# Patient Record
Sex: Female | Born: 1960 | Race: Black or African American | Hispanic: No
Health system: Southern US, Community
[De-identification: ages and names within clinical notes are randomized; demographics above are authoritative.]

---

## 2012-12-17 ENCOUNTER — Emergency Department: Payer: Self-pay | Admitting: Emergency Medicine

## 2012-12-17 LAB — CBC
HCT: 45.2 % (ref 35.0–47.0)
HGB: 14.5 g/dL (ref 12.0–16.0)
MCHC: 32 g/dL (ref 32.0–36.0)
MCV: 71 fL — ABNORMAL LOW (ref 80–100)
Platelet: 192 10*3/uL (ref 150–440)
RDW: 14.7 % — ABNORMAL HIGH (ref 11.5–14.5)
WBC: 8.4 10*3/uL (ref 3.6–11.0)

## 2012-12-17 LAB — URINALYSIS, COMPLETE
Bacteria: NONE SEEN
Blood: NEGATIVE
Ketone: NEGATIVE
Nitrite: NEGATIVE
Ph: 6 (ref 4.5–8.0)
RBC,UR: 4 /HPF (ref 0–5)
Squamous Epithelial: 6

## 2012-12-17 LAB — COMPREHENSIVE METABOLIC PANEL
Albumin: 3.8 g/dL (ref 3.4–5.0)
Alkaline Phosphatase: 74 U/L (ref 50–136)
Anion Gap: 4 — ABNORMAL LOW (ref 7–16)
Bilirubin,Total: 0.6 mg/dL (ref 0.2–1.0)
Calcium, Total: 8.5 mg/dL (ref 8.5–10.1)
EGFR (Non-African Amer.): >60
Osmolality: 277 (ref 275–301)
Potassium: 4 mmol/L (ref 3.5–5.1)
Sodium: 139 mmol/L (ref 136–145)
Total Protein: 7.7 g/dL (ref 6.4–8.2)

## 2013-08-29 ENCOUNTER — Observation Stay: Payer: Self-pay | Admitting: Internal Medicine

## 2013-08-29 LAB — DRUG SCREEN, URINE
Benzodiazepine, Ur Scrn: NEGATIVE (ref ?–200)
MDMA (Ecstasy)Ur Screen: NEGATIVE (ref ?–500)
Methadone, Ur Screen: NEGATIVE (ref ?–300)
Opiate, Ur Screen: NEGATIVE (ref ?–300)
Phencyclidine (PCP) Ur S: NEGATIVE (ref ?–25)

## 2013-08-29 LAB — COMPREHENSIVE METABOLIC PANEL
Albumin: 3.3 g/dL — ABNORMAL LOW (ref 3.4–5.0)
Alkaline Phosphatase: 84 U/L (ref 50–136)
Bilirubin,Total: 0.5 mg/dL (ref 0.2–1.0)
Calcium, Total: 8.3 mg/dL — ABNORMAL LOW (ref 8.5–10.1)
Chloride: 111 mmol/L — ABNORMAL HIGH (ref 98–107)
Creatinine: 1.01 mg/dL (ref 0.60–1.30)
EGFR (Non-African Amer.): >60
Glucose: 93 mg/dL (ref 65–99)
Osmolality: 280 (ref 275–301)
SGOT(AST): 16 U/L (ref 15–37)

## 2013-08-29 LAB — CBC
HCT: 40.1 % (ref 35.0–47.0)
MCHC: 31.9 g/dL — ABNORMAL LOW (ref 32.0–36.0)
MCV: 71 fL — ABNORMAL LOW (ref 80–100)
Platelet: 174 10*3/uL (ref 150–440)
RBC: 5.67 10*6/uL — ABNORMAL HIGH (ref 3.80–5.20)
RDW: 14.7 % — ABNORMAL HIGH (ref 11.5–14.5)
WBC: 11.9 10*3/uL — ABNORMAL HIGH (ref 3.6–11.0)

## 2013-08-29 LAB — URINALYSIS, COMPLETE
Bacteria: NONE SEEN
Bilirubin,UR: NEGATIVE
Glucose,UR: NEGATIVE mg/dL (ref 0–75)
Hyaline Cast: 8
Leukocyte Esterase: NEGATIVE
Protein: 100
Specific Gravity: 1.021 (ref 1.003–1.030)
Squamous Epithelial: 5

## 2013-08-29 LAB — CK TOTAL AND CKMB (NOT AT ARMC)
CK, Total: 47 U/L (ref 21–215)
CK, Total: 39 U/L (ref 21–215)
CK-MB: 0.5 ng/mL (ref 0.5–3.6)
CK-MB: <0.5 ng/mL — ABNORMAL LOW (ref 0.5–3.6)
CK-MB: <0.5 ng/mL — ABNORMAL LOW (ref 0.5–3.6)

## 2013-08-29 LAB — MAGNESIUM: Magnesium: 1.8 mg/dL

## 2013-08-29 LAB — PROTIME-INR
INR: 1.1
Prothrombin Time: 14.2 secs (ref 11.5–14.7)

## 2013-08-29 LAB — TROPONIN I
Troponin-I: <0.02 ng/mL
Troponin-I: <0.02 ng/mL

## 2013-08-30 LAB — CBC WITH DIFFERENTIAL/PLATELET
Basophil %: 0.5 %
Eosinophil #: 0.1 10*3/uL (ref 0.0–0.7)
Eosinophil %: 1.3 %
Lymphocyte #: 3.6 10*3/uL (ref 1.0–3.6)
Lymphocyte %: 40.6 %
Monocyte #: 0.4 x10 3/mm (ref 0.2–0.9)
Neutrophil #: 4.6 10*3/uL (ref 1.4–6.5)
Neutrophil %: 52.6 %
Platelet: 166 10*3/uL (ref 150–440)
WBC: 8.8 10*3/uL (ref 3.6–11.0)

## 2013-08-30 LAB — BASIC METABOLIC PANEL
Anion Gap: 7 (ref 7–16)
BUN: 9 mg/dL (ref 7–18)
Calcium, Total: 7.9 mg/dL — ABNORMAL LOW (ref 8.5–10.1)
Co2: 23 mmol/L (ref 21–32)
Creatinine: 0.85 mg/dL (ref 0.60–1.30)
EGFR (Non-African Amer.): >60
Glucose: 68 mg/dL (ref 65–99)
Sodium: 142 mmol/L (ref 136–145)

## 2015-02-03 NOTE — H&P (Signed)
PATIENT NAME:  [name], Renee MR#:  [id number] DATE OF BIRTH:  12/14/1960  DATE OF ADMISSION:  08/29/2013  REFERRING PHYSICIAN:  Loraine LericheMark R. Fanny BienQuale, MD  FAMILY PHYSICIAN: None.   REASON FOR ADMISSION: Syncope.   HISTORY OF PRESENT ILLNESS: The patient is a 54 year old female with no significant past medical history, on no medications, who presents with a syncopal episode this morning. Arrived to the Emergency Room where she was lethargic and found to have an abnormal EKG with prolonged QT interval. She denies any previous cardiac history. No previous history of syncope. She is now admitted for further evaluation. Denies chest pain or palpitations.   PAST MEDICAL HISTORY:  Unremarkable.   MEDICATIONS: None.   ALLERGIES: No known drug allergies.   SOCIAL HISTORY: The patient denies alcohol or illicit drug use. She does smoke daily.   FAMILY HISTORY: Negative for coronary artery disease or stroke. Negative for colon or breast cancer. Negative for seizures.   REVIEW OF SYSTEMS: CONSTITUTIONAL: No fever or change in weight.  EYES: No blurred or double vision. No glaucoma.  ENT: No tinnitus or hearing loss. No nasal discharge or bleeding. No difficulty swallowing.  RESPIRATORY: No cough or wheezing. Denies hemoptysis. No painful respiration.  CARDIOVASCULAR: No chest pain or orthopnea. No palpitations.  GASTROINTESTINAL: No nausea, vomiting or diarrhea. No abdominal pain. No change in bowel habits.  GENITOURINARY: No dysuria or hematuria. No incontinence.  ENDOCRINE: No polyuria or polydipsia. No heat or cold intolerance.  HEMATOLOGIC: The patient denies anemia, easy bruising or bleeding.  LYMPHATIC: No swollen glands.  MUSCULOSKELETAL: The patient denies pain in her neck, back, shoulders, knees or hips. No gout.  NEUROLOGIC: No numbness or migraines. Denies stroke or seizures.  PSYCHIATRIC: The patient denies anxiety, insomnia or depression.   PHYSICAL EXAMINATION: GENERAL: The patient is  in no acute distress.  VITAL SIGNS: Currently remarkable for blood pressure of 105/65, heart rate 85, respiratory rate of 20. Temperature 97.6.  HEENT: Normocephalic, atraumatic. Pupils equally round and reactive to light and accommodation. Extraocular movements are intact. Sclerae are anicteric. Conjunctivae are clear. Oropharynx clear.   NECK: Supple without JVD or bruits. No adenopathy or thyromegaly was noted.  LUNGS: Clear to auscultation and percussion without wheezes, rales or rhonchi. No dullness. Respiratory effort is normal.  CARDIAC: Regular rate and rhythm with normal S1, S2. No significant rubs, murmurs or gallops. PMI is nondisplaced. Chest wall is nontender.  ABDOMEN: Soft, nontender, with normoactive bowel sounds. No organomegaly or masses were appreciated. No hernias or bruits were noted.  EXTREMITIES: Without clubbing, cyanosis or edema. Pulses were 2+ bilaterally.  SKIN: Warm and dry without rash or lesions.  NEUROLOGIC: Cranial nerves II through XII grossly intact. Deep tendon reflexes were symmetric. Motor and sensory exam is nonfocal.  PSYCHIATRIC: Exam revealed a patient who is alert and oriented to person, place and time. She was cooperative and used good judgment.   LABORATORY, DIAGNOSTIC AND RADIOLOGICAL DATA: EKG revealed sinus rhythm with prolonged QT interval. Chest x-ray was unremarkable. CT angiography of the chest, abdomen and pelvis was unremarkable except for some possible ascending colon inflammatory changes. Head CT was unremarkable. Urinalysis was negative. Lipase was 87. Glucose 93 with a BUN of 14, creatinine 1.01 with a GFR of greater than 60. White count 11.9 with a hemoglobin of 12.8. Troponin was less than 0.02.   ASSESSMENT: 1.  Syncope.  2.  Prolonged QT interval of unclear etiology.  3.  Leukocytosis.  4.  Lethargy.   PLAN:  The patient will be observed on telemetry. We will check a magnesium level. We will follow serial cardiac enzymes. We will check  carotid Dopplers and an echocardiogram. We will consult cardiology because of her abnormal EKG and syncopal episode. Follow up routine labs in the morning. Further treatment and evaluation will depend upon the patient's progress.   TOTAL TIME SPENT ON THIS PATIENT: 45 minutes.    ____________________________ Duane Lope Judithann Sheen, MD jds:cs D: 08/29/2013 15:04:15 ET T: 08/29/2013 15:40:46 ET JOB#: [id number]  cc: Duane Lope. Judithann Sheen, MD, <Dictator> JEFFREY Rodena Medin MD ELECTRONICALLY SIGNED 08/30/2013 8:03

## 2015-02-03 NOTE — Discharge Summary (Signed)
PATIENT NAMSuzi Hamilton:  [name], [name] MR#:  [id number] DATE OF BIRTH:  [birth year]  DATE OF ADMISSION:  08/29/2013 DATE OF DISCHARGE:  08/30/2013  PRESENTING COMPLAINT: Syncopal episode.   DISCHARGE DIAGNOSES:  1.  Syncope, suspected due to low blood pressure.  2.  Polysubstance abuse.   CONDITION ON DISCHARGE: Fair.   CODE STATUS: Full code.   MEDICATIONS: Tylenol 325 mg 2 tablets q.4 hours as needed.   LABORATORY DATA: White count is 8.8, hemoglobin and hematocrit is 11.6 and 35.7. Basic metabolic panel within normal limits except potassium of 3.4 and chloride of 112. Cardiac enzymes x 3 negative. Ultrasound Doppler carotids: No stenosis. CT angiography of the chest, abdomen and pelvis shows normal appearance of the abdominal aorta and branch vessel, partial exclusion of low pelvis, possible ascending colonic wall thickening and colitis. This is in the region of  motion degradation and gastric antral wall thickening could be due to distention and left nephrolithiasis. CT head is no acute abnormality. UA negative for UTI. Urine drug screen positive for cannabinoids and cocaine. PT-INR is 4.2 and 1.1. Echo Doppler: Completed results are pending.   BRIEF SUMMARY OF HOSPITAL COURSE: Renee RootsShelia Hamilton is a 54 year old African American female with history of tobacco abuse, comes in with:  1.  Syncopal episode. The patient was admitted on the telemetry floor where she remained in sinus rhythm. No cardiac arrhythmias were noted. Her initial EKG showed prolonged QTc interval. She was monitored overnight. Electrolytes are stable. The patient remained in sinus rhythm. Cardiology saw the patient and recommended echo, which was done; however, results are still pending. The patient will follow up with primary care physician as outpatient. Her blood pressure remained stable. CT head was negative. Ultrasound carotid negative. CT chest, abdomen and pelvis nothing acute.  2.  Polysubstance abuse. Her urine drug screen  positive for cocaine and marijuana. The patient advised cessation.  3.  Tobacco abuse. The patient advised cessation.  4.  Relative hypotension. Received IV fluids. The patient asymptomatic. Ambulated well without any complaints. Hospital stay remained stable.   TIME SPENT: 40 minutes.  ____________________________ Wylie HailSona A. Allena KatzPatel, MD sap:aw D: 08/31/2013 07:13:13 ET T: 08/31/2013 07:24:38 ET JOB#: [id number]  cc: [name] A. Allena KatzPatel, MD, <Dictator> Willow OraSONA A Torrell Krutz MD ELECTRONICALLY SIGNED 09/06/2013 14:13

## 2015-02-03 NOTE — Consult Note (Signed)
PATIENT NAME:  [name], [name], [name]  DATE OF CONSULTATION:  08/29/2013  REFERRING PHYSICIAN:   CONSULTING PHYSICIAN:  Laurier NancyShaukat A. Khan, MD  INDICATION FOR CONSULTATION: Syncope.    HISTORY OF PRESENT ILLNESS:  This is a 54 year old African American female with a past medical history of no significant medical problems, came into the Emergency Room with a frank episode of syncope. The patient states she was at her mother's house where she all of a sudden felt hot, dizzy and passed out. Her sister tried to wake her up and she was brought to the Emergency Room. In the Emergency Room, she has been alert, oriented, no acute distress.   PAST MEDICAL HISTORY:  History of syncope 3 years ago. No history of diabetes, hypertension, hyperlipidemia.   SOCIAL HISTORY: She smokes 1/2 pack per day. No EtOH abuse.   FAMILY HISTORY: Positive for coronary artery disease.   PHYSICAL EXAMINATION: GENERAL: She is alert, oriented x 3, in no acute distress.  VITAL SIGNS: Stable.  NECK: No JVD.  LUNGS: Clear.  HEART: Regular rate and rhythm. Normal S1, S2. No audible murmur.  ABDOMEN: Soft, nontender, positive bowel sounds.  EXTREMITIES: No pedal edema.  NEUROLOGIC: She appears to be intact.   LABORATORY AND DIAGNOSTIC DATA:  EKG shows sinus bradycardia, 57 beats per minute with prolonged QT interval, QT is 496, QTc is 482. Electrolytes appeared to be normal  but magnesium is pending, potassium is normal.   ASSESSMENT AND PLAN: Syncope, etiology unclear. Agree with getting carotid Dopplers. CT of the head was negative. She is also getting CT of the chest and abdomen and pelvis. Agree with getting magnesium and will look also at echocardiogram.   Thank you very much for the referral   ____________________________ Laurier NancyShaukat A. Khan, MD sak:cs D: 08/29/2013 15:09:56 ET T: 08/29/2013 15:23:52 ET JOB#: [id number]  cc: Laurier NancyShaukat A. Khan, MD, <Dictator> Laurier NancySHAUKAT A KHAN  MD ELECTRONICALLY SIGNED 09/20/2013 8:31

## 2015-07-01 IMAGING — CT CT HEAD WITHOUT CONTRAST
1 series · 16 of 29 positions shown, 20 images · non-contrast
Comparison: None

CLINICAL DATA: Syncopal episode

EXAM:
CT HEAD WITHOUT CONTRAST
TECHNIQUE: Contiguous axial images were obtained from the base of the skull
through the vertex without contrast.

[Series 2: head wo · axial · 0.39mm/px · z∈[-12,+118]mm · 16 of 29 slices shown, 20 images]
[im 2/29  brain]
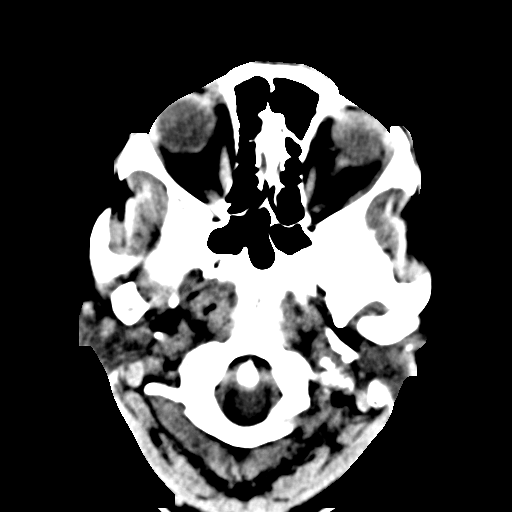
[im 2/29  bone]
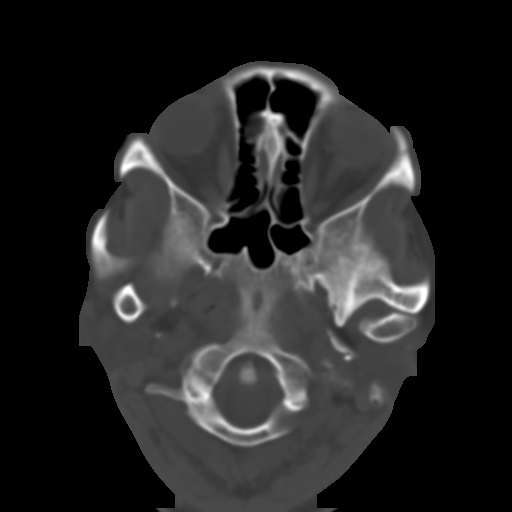
[im 4/29  brain]
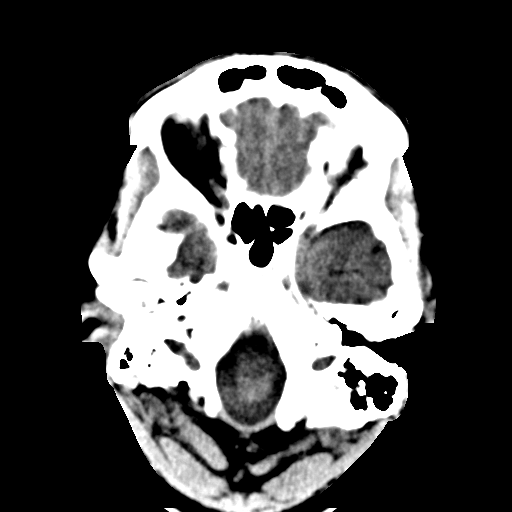
[im 6/29  brain]
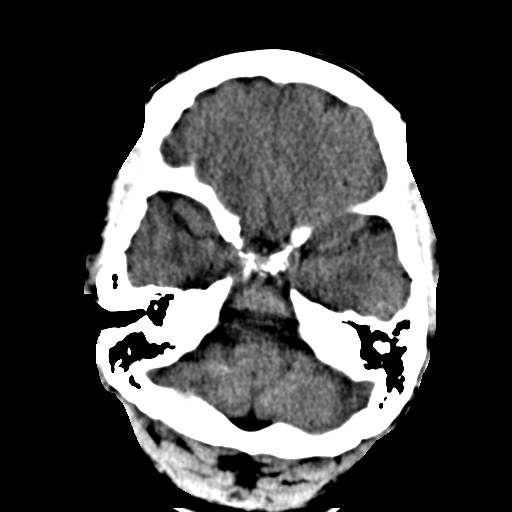
[im 7/29  brain]
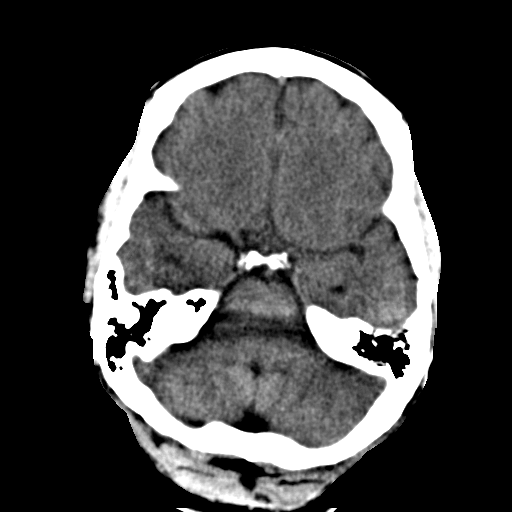
[im 9/29  brain]
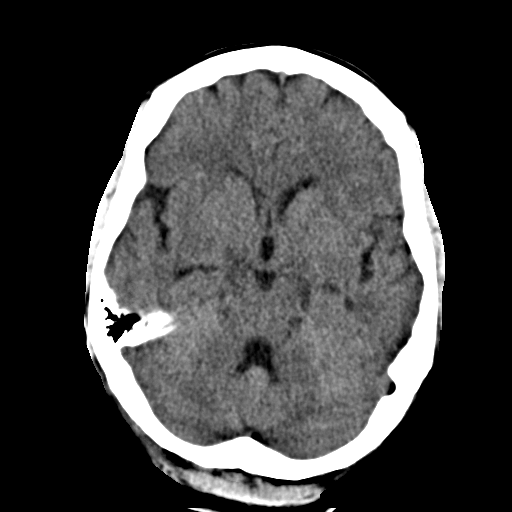
[im 9/29  bone]
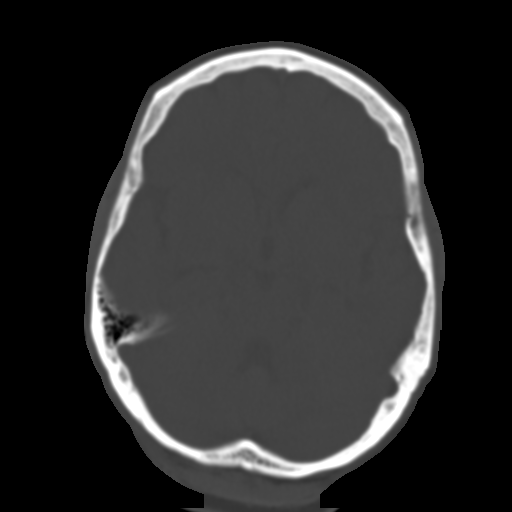
[im 11/29  brain]
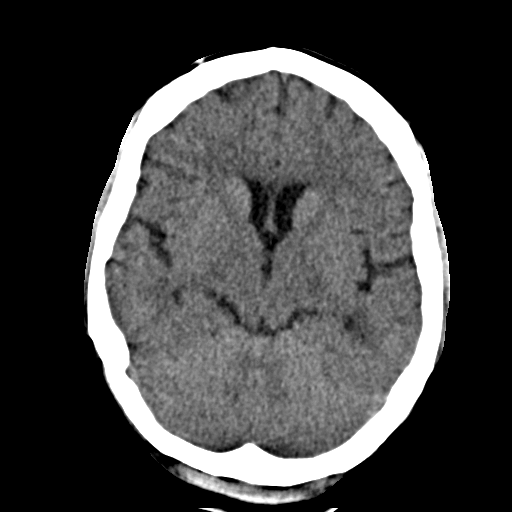
[im 12/29  brain]
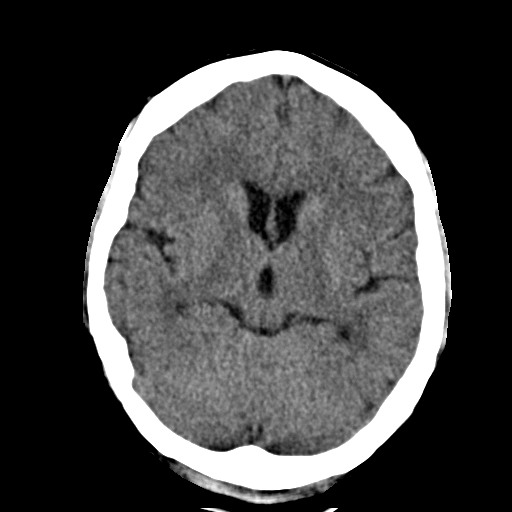
[im 14/29  brain]
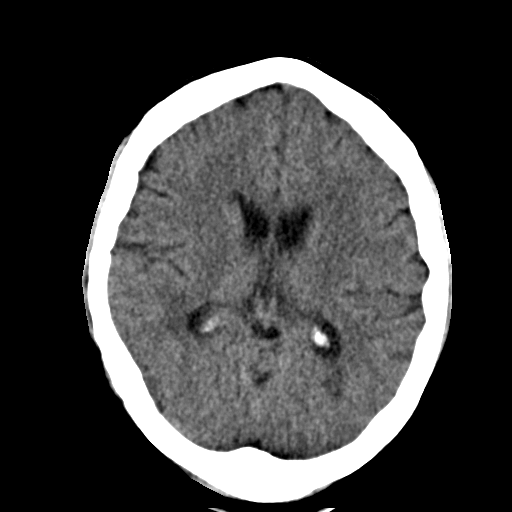
[im 16/29  brain]
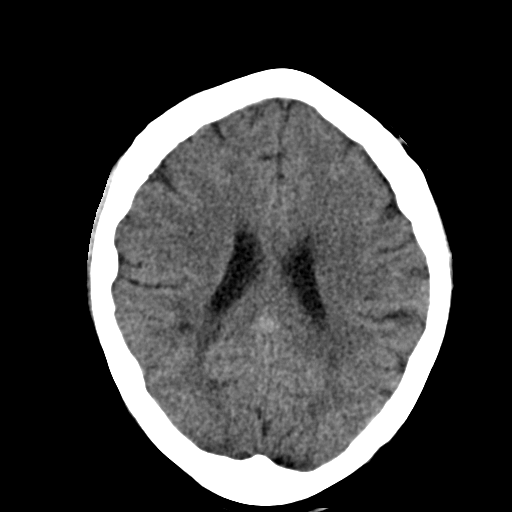
[im 16/29  bone]
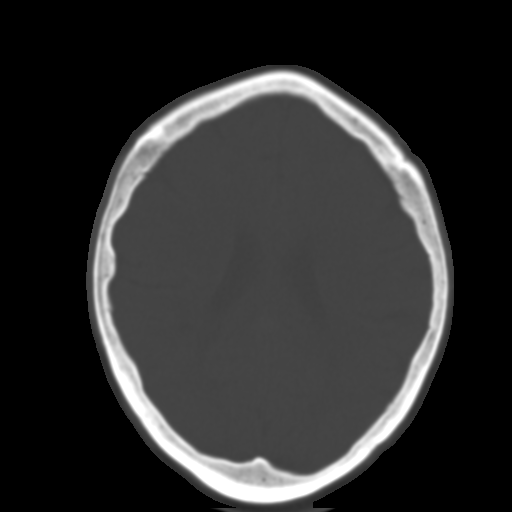
[im 18/29  brain]
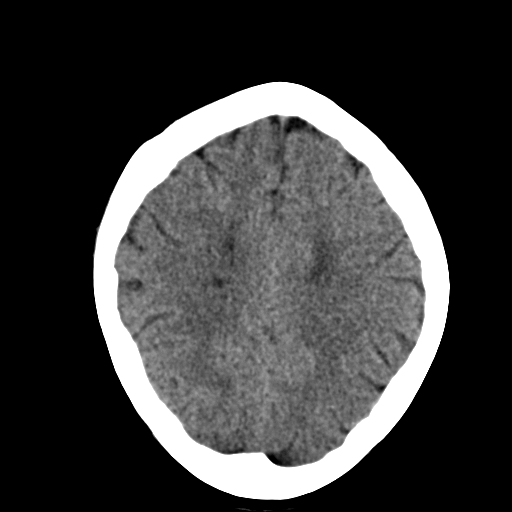
[im 19/29  brain]
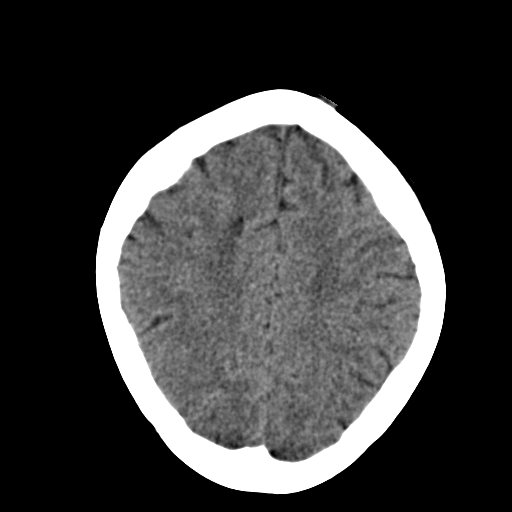
[im 21/29  brain]
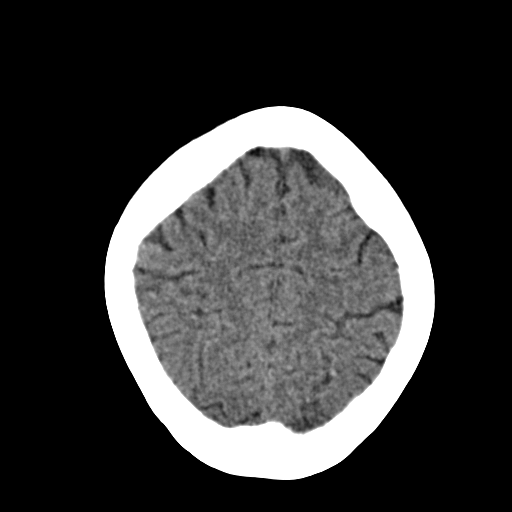
[im 23/29  brain]
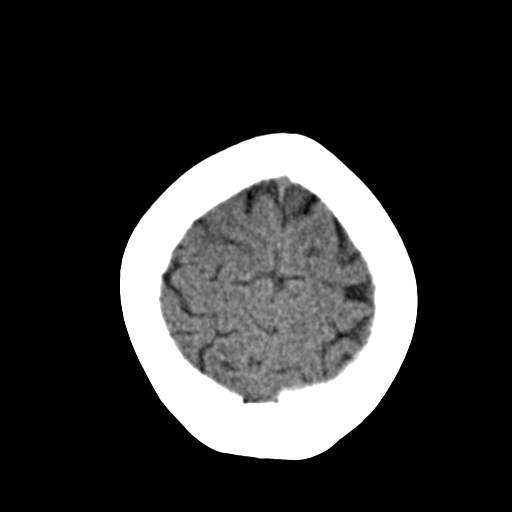
[im 23/29  bone]
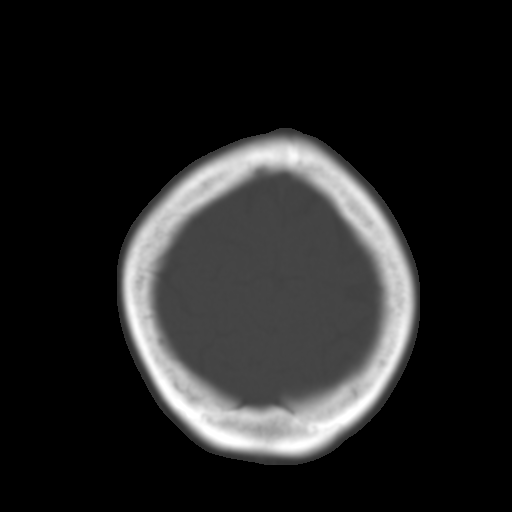
[im 24/29  brain]
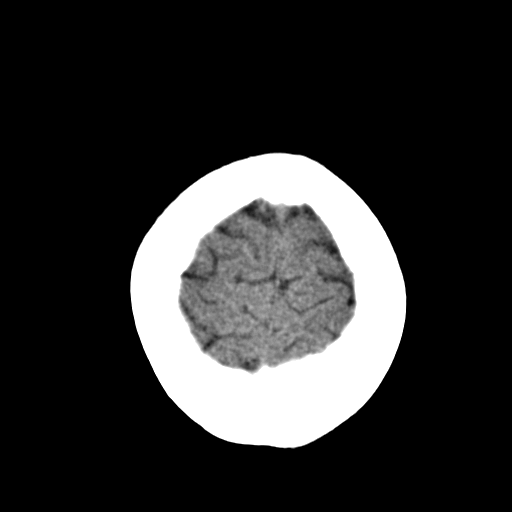
[im 26/29  brain]
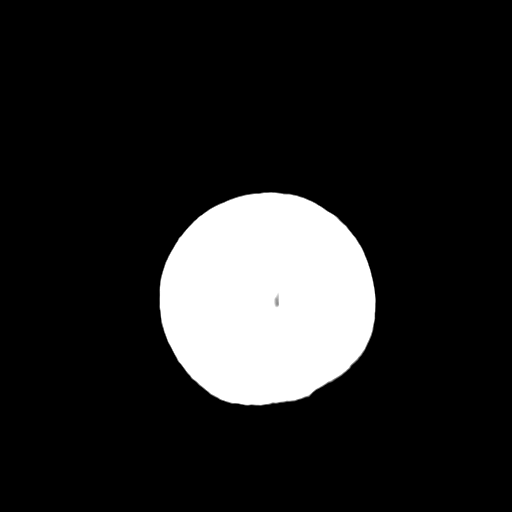
[im 28/29  brain]
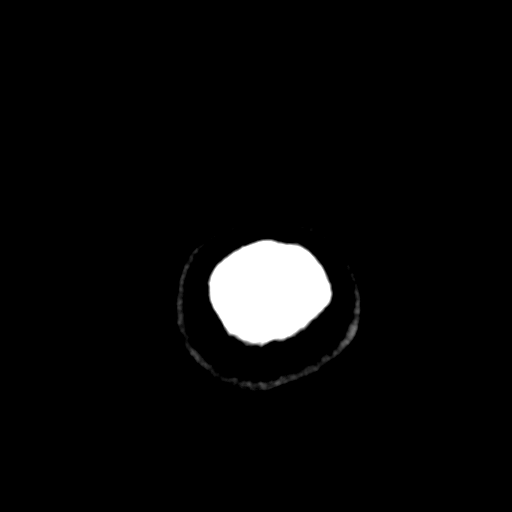

[16 of 29 positions shown; findings below may reference images not displayed]

FINDINGS: No acute intracranial hemorrhage, mass lesion, definite infarction,
midline shift, herniation, hydrocephalus, or extra-axial fluid
collection. Minor periventricular patchy white matter microvascular
ischemic change. Cisterns patent. No cerebellar abnormality.
Mastoids and sinuses clear. No skull abnormality.
IMPRESSION: No acute intracranial finding
